# Patient Record
Sex: Male | Born: 1995 | Hispanic: Yes | Marital: Single | Smoking: Light tobacco smoker
Health system: Southern US, Community
[De-identification: ages and names within clinical notes are randomized; demographics above are authoritative.]

## PROBLEM LIST (undated history)

## (undated) DIAGNOSIS — F909 Attention-deficit hyperactivity disorder, unspecified type: Secondary | ICD-10-CM

## (undated) DIAGNOSIS — F319 Bipolar disorder, unspecified: Secondary | ICD-10-CM

## (undated) HISTORY — PX: TESTICLE SURGERY: SHX794

## (undated) HISTORY — PX: TONSILLECTOMY: SUR1361

## (undated) HISTORY — PX: URETHRA SURGERY: SHX824

---

## 2014-03-29 ENCOUNTER — Emergency Department (HOSPITAL_BASED_OUTPATIENT_CLINIC_OR_DEPARTMENT_OTHER)
Admission: EM | Admit: 2014-03-29 | Discharge: 2014-03-29 | Disposition: A | Discharge status: Home / Self Care | Payer: Self-pay | Attending: Emergency Medicine | Admitting: Emergency Medicine

## 2014-03-29 ENCOUNTER — Encounter (HOSPITAL_BASED_OUTPATIENT_CLINIC_OR_DEPARTMENT_OTHER): Payer: Self-pay | Admitting: Emergency Medicine

## 2014-03-29 DIAGNOSIS — Z043 Encounter for examination and observation following other accident: Secondary | ICD-10-CM | POA: Insufficient documentation

## 2014-03-29 DIAGNOSIS — Y9389 Activity, other specified: Secondary | ICD-10-CM | POA: Insufficient documentation

## 2014-03-29 DIAGNOSIS — Z8659 Personal history of other mental and behavioral disorders: Secondary | ICD-10-CM | POA: Insufficient documentation

## 2014-03-29 DIAGNOSIS — Y92410 Unspecified street and highway as the place of occurrence of the external cause: Secondary | ICD-10-CM | POA: Insufficient documentation

## 2014-03-29 HISTORY — DX: Attention-deficit hyperactivity disorder, unspecified type: F90.9

## 2014-03-29 MED ORDER — ACETAMINOPHEN 325 MG PO TABS
650.0000 mg | ORAL_TABLET | Freq: Once | ORAL | Status: AC
  Administered 2014-03-29: 650 mg via ORAL
  Filled 2014-03-29: qty 2
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
## 2014-03-29 NOTE — Discharge Instructions (Signed)
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
## 2015-03-01 ENCOUNTER — Emergency Department (HOSPITAL_COMMUNITY): Payer: 59

## 2015-03-01 ENCOUNTER — Emergency Department (HOSPITAL_COMMUNITY)
Admission: EM | Admit: 2015-03-01 | Discharge: 2015-03-01 | Disposition: A | Discharge status: Home / Self Care | Payer: 59 | Attending: Emergency Medicine | Admitting: Emergency Medicine

## 2015-03-01 ENCOUNTER — Encounter (HOSPITAL_COMMUNITY): Payer: Self-pay

## 2015-03-01 DIAGNOSIS — Y92410 Unspecified street and highway as the place of occurrence of the external cause: Secondary | ICD-10-CM | POA: Diagnosis not present

## 2015-03-01 DIAGNOSIS — S52602A Unspecified fracture of lower end of left ulna, initial encounter for closed fracture: Secondary | ICD-10-CM

## 2015-03-01 DIAGNOSIS — S40812A Abrasion of left upper arm, initial encounter: Secondary | ICD-10-CM

## 2015-03-01 DIAGNOSIS — S52615A Nondisplaced fracture of left ulna styloid process, initial encounter for closed fracture: Secondary | ICD-10-CM | POA: Insufficient documentation

## 2015-03-01 DIAGNOSIS — Z720 Tobacco use: Secondary | ICD-10-CM | POA: Diagnosis not present

## 2015-03-01 DIAGNOSIS — S59912A Unspecified injury of left forearm, initial encounter: Secondary | ICD-10-CM | POA: Diagnosis present

## 2015-03-01 DIAGNOSIS — Y998 Other external cause status: Secondary | ICD-10-CM | POA: Insufficient documentation

## 2015-03-01 DIAGNOSIS — Z8659 Personal history of other mental and behavioral disorders: Secondary | ICD-10-CM | POA: Insufficient documentation

## 2015-03-01 DIAGNOSIS — S52515A Nondisplaced fracture of left radial styloid process, initial encounter for closed fracture: Secondary | ICD-10-CM | POA: Insufficient documentation

## 2015-03-01 DIAGNOSIS — S50312A Abrasion of left elbow, initial encounter: Secondary | ICD-10-CM | POA: Diagnosis not present

## 2015-03-01 DIAGNOSIS — S52502A Unspecified fracture of the lower end of left radius, initial encounter for closed fracture: Secondary | ICD-10-CM

## 2015-03-01 DIAGNOSIS — Y9389 Activity, other specified: Secondary | ICD-10-CM | POA: Diagnosis not present

## 2015-03-01 HISTORY — DX: Bipolar disorder, unspecified: F31.9

## 2015-03-01 MED ORDER — HYDROMORPHONE HCL 1 MG/ML IJ SOLN
1.0000 mg | Freq: Once | INTRAMUSCULAR | Status: AC
  Administered 2015-03-01: 1 mg via INTRAMUSCULAR
  Filled 2015-03-01: qty 1

## 2015-03-01 MED ORDER — OXYCODONE-ACETAMINOPHEN 5-325 MG PO TABS: 1.0000 | ORAL_TABLET | Freq: Four times a day (QID) | ORAL | Status: DC | PRN

## 2015-03-01 MED ORDER — TETANUS-DIPHTH-ACELL PERTUSSIS 5-2.5-18.5 LF-MCG/0.5 IM SUSP
0.5000 mL | Freq: Once | INTRAMUSCULAR | Status: AC
Start: 2015-03-01 — End: 2015-03-01
  Administered 2015-03-01: 0.5 mL via INTRAMUSCULAR
  Filled 2015-03-01: qty 0.5

## 2015-03-01 MED ORDER — ACETAMINOPHEN 325 MG PO TABS
650.0000 mg | ORAL_TABLET | Freq: Once | ORAL | Status: AC
  Administered 2015-03-01: 650 mg via ORAL
  Filled 2015-03-01: qty 2

## 2015-03-01 NOTE — ED Notes (Signed)
Ortho tech paged to apply splint.

## 2015-03-01 NOTE — Discharge Instructions (Signed)
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
## 2015-03-01 NOTE — Progress Notes (Signed)
Orthopedic Tech Progress Note Patient Details:  Craig MannanJosiah Sullivan 08/10/1996 [id number]  Ortho Devices Type of Ortho Device: Ace wrap, Arm sling, Sugartong splint Ortho Device/Splint Location: lue Ortho Device/Splint Interventions: Application   Ryann Pauli 03/01/2015, 8:57 AM
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
## 2015-10-21 ENCOUNTER — Emergency Department (HOSPITAL_BASED_OUTPATIENT_CLINIC_OR_DEPARTMENT_OTHER)
Admission: EM | Admit: 2015-10-21 | Discharge: 2015-10-21 | Disposition: A | Discharge status: Home / Self Care | Payer: 59 | Attending: Emergency Medicine | Admitting: Emergency Medicine

## 2015-10-21 ENCOUNTER — Encounter (HOSPITAL_BASED_OUTPATIENT_CLINIC_OR_DEPARTMENT_OTHER): Payer: Self-pay | Admitting: *Deleted

## 2015-10-21 DIAGNOSIS — R300 Dysuria: Secondary | ICD-10-CM | POA: Diagnosis not present

## 2015-10-21 DIAGNOSIS — Z8659 Personal history of other mental and behavioral disorders: Secondary | ICD-10-CM | POA: Insufficient documentation

## 2015-10-21 DIAGNOSIS — R05 Cough: Secondary | ICD-10-CM | POA: Diagnosis present

## 2015-10-21 DIAGNOSIS — M545 Low back pain: Secondary | ICD-10-CM | POA: Diagnosis not present

## 2015-10-21 DIAGNOSIS — R69 Illness, unspecified: Secondary | ICD-10-CM

## 2015-10-21 DIAGNOSIS — F17210 Nicotine dependence, cigarettes, uncomplicated: Secondary | ICD-10-CM | POA: Insufficient documentation

## 2015-10-21 DIAGNOSIS — J111 Influenza due to unidentified influenza virus with other respiratory manifestations: Secondary | ICD-10-CM | POA: Insufficient documentation

## 2015-10-21 LAB — URINALYSIS, ROUTINE W REFLEX MICROSCOPIC
Bilirubin Urine: NEGATIVE
Glucose, UA: NEGATIVE mg/dL
Hgb urine dipstick: NEGATIVE
KETONES UR: NEGATIVE mg/dL
NITRITE: NEGATIVE
PH: 6.5 (ref 5.0–8.0)
PROTEIN: NEGATIVE mg/dL
Specific Gravity, Urine: 1.006 (ref 1.005–1.030)

## 2015-10-21 LAB — URINE MICROSCOPIC-ADD ON: RBC / HPF: NONE SEEN RBC/hpf (ref 0–5)

## 2015-10-21 NOTE — Discharge Instructions (Signed)
Please read and follow all provided instructions.  Your diagnoses today include:  1. Influenza-like illness    Tests performed today include:  Vital signs. See below for your results today.   Medications prescribed:   None  Take any prescribed medications only as directed.  Home care instructions:  Follow any educational materials contained in this packet. Please continue drinking plenty of fluids. Use over-the-counter cold and flu medications as needed as directed on packaging for symptom relief. You may also use ibuprofen or tylenol as directed on packaging for pain or fever.   BE VERY CAREFUL not to take multiple medicines containing Tylenol (also called acetaminophen). Doing so can lead to an overdose which can damage your liver and cause liver failure and possibly death.   Follow-up instructions: Please follow-up with your primary care provider in the next 3 days for further evaluation of your symptoms if not improved.   Return instructions:   Please return to the Emergency Department if you experience worsening symptoms.  Please return if you have a high fever greater than 101 degrees not controlled with over-the-counter medications, persistent vomiting and cannot keep down fluids, or worsening trouble breathing.  Please return if you have any other emergent concerns.  Additional Information:  Your vital signs today were: BP 156/82 mmHg   Pulse 99   Temp(Src) 100.3 F (37.9 C) (Oral)   Resp 20   Wt 65.772 kg   SpO2 98% If your blood pressure (BP) was elevated above 135/85 this visit, please have this repeated by your doctor within one month.

## 2015-10-21 NOTE — ED Provider Notes (Signed)
CSN: [id number]     Arrival date & time 10/21/15  2003 History   First MD Initiated Contact with Patient 10/21/15 2256     Chief Complaint  Patient presents with  . Illness     (Consider location/radiation/quality/duration/timing/severity/associated sxs/prior Treatment) HPI Comments: Patient presents with complaint of fever, chills, body aches, occasional cough, nasal congestion, lower back cramps, and dysuria at end of urine stream starting yesterday after taking a nap. Patient states symptoms started suddenly. Possible sick contacts. No nausea, vomiting, or diarrhea. No hematuria or increased frequency or urgency. Onset of symptoms acute. Course is constant. Nothing makes symptoms better or worse. Patient has been taking DayQuil which has helped.  Patient is a 20 y.o. male presenting with general illness. The history is provided by the patient.  Illness Associated symptoms: congestion, cough, fatigue, fever, headaches, myalgias and rhinorrhea   Associated symptoms: no abdominal pain, no diarrhea, no ear pain, no nausea, no rash, no sore throat, no vomiting and no wheezing     Past Medical History  Diagnosis Date  . ADHD (attention deficit hyperactivity disorder)   . Bipolar 1 disorder [facility]    Past Surgical History  Procedure Laterality Date  . Urethra surgery    . Testicle surgery     No family history on file. Social History  Substance Use Topics  . Smoking status: Light Tobacco Smoker    Types: Cigars  . Smokeless tobacco: None  . Alcohol Use: No    Review of Systems  Constitutional: Positive for fever, chills and fatigue.  HENT: Positive for congestion and rhinorrhea. Negative for ear pain, sinus pressure and sore throat.   Eyes: Negative for redness.  Respiratory: Positive for cough. Negative for wheezing.   Gastrointestinal: Negative for nausea, vomiting, abdominal pain and diarrhea.  Genitourinary: Positive for dysuria. Negative for frequency and hematuria.   Musculoskeletal: Positive for myalgias. Negative for neck stiffness.  Skin: Negative for rash.  Neurological: Positive for headaches.  Hematological: Negative for adenopathy.    Allergies  Review of patient's allergies indicates no known allergies.  Home Medications   Prior to Admission medications   Medication Sig Start Date End Date Taking? Authorizing Provider  oxyCODONE-acetaminophen (PERCOCET) 5-325 MG per tablet Take 1 tablet by mouth every 6 (six) hours as needed for moderate pain. 03/01/15   Purvis Sheffield, MD   BP 156/82 mmHg  Pulse 99  Temp(Src) 100.3 F (37.9 C) (Oral)  Resp 20  Wt 65.772 kg  SpO2 98%   Physical Exam  Constitutional: He appears well-developed and well-nourished.  HENT:  Head: Normocephalic and atraumatic.  Right Ear: Tympanic membrane, external ear and ear canal normal.  Left Ear: Tympanic membrane, external ear and ear canal normal.  Nose: Nose normal. No mucosal edema or rhinorrhea.  Mouth/Throat: Uvula is midline, oropharynx is clear and moist and mucous membranes are normal. Mucous membranes are not dry. No trismus in the jaw. No uvula swelling. No oropharyngeal exudate, posterior oropharyngeal edema, posterior oropharyngeal erythema or tonsillar abscesses.  Eyes: Conjunctivae are normal. Right eye exhibits no discharge. Left eye exhibits no discharge.  Neck: Normal range of motion. Neck supple.  Cardiovascular: Normal rate, regular rhythm and normal heart sounds.   Pulmonary/Chest: Effort normal and breath sounds normal. No respiratory distress. He has no wheezes. He has no rales.  Abdominal: Soft. There is no tenderness.  Neurological: He is alert.  Skin: Skin is warm and dry.  Psychiatric: He has a normal mood and affect.  Nursing note  and vitals reviewed.   ED Course  Procedures (including critical care time) Labs Review Labs Reviewed  URINALYSIS, ROUTINE W REFLEX MICROSCOPIC (NOT AT [facility]) - Abnormal; Notable for the following:     Leukocytes, UA SMALL (*)    All other components within normal limits  URINE MICROSCOPIC-ADD ON - Abnormal; Notable for the following:    Squamous Epithelial / LPF 0-5 (*)    Bacteria, UA RARE (*)    All other components within normal limits    Imaging Review No results found. I have personally reviewed and evaluated these images and lab results as part of my medical decision-making.   EKG Interpretation None      11:34 PM Patient seen and examined.    Vital signs reviewed and are as follows: BP 156/82 mmHg  Pulse 99  Temp(Src) 100.3 F (37.9 C) (Oral)  Resp 20  Wt 65.772 kg  SpO2 98%  Patient discharged to home. Encouraged to rest and drink plenty of fluids.  Patient told to return to ED or see their primary doctor if their symptoms worsen, high fever not controlled with tylenol, persistent vomiting, they feel they are dehydrated, or if they have any other concerns.  Patient verbalized understanding and agreed with plan.     MDM   Final diagnoses:  Influenza-like illness   Patient with symptoms consistent with influenza. Vitals are stable, low-grade fever. No signs of dehydration, tolerating PO's. Lungs are clear. Supportive therapy indicated with return if symptoms worsen. Patient counseled.    Renne Crigler, PA-C 10/21/15 2334  Cy Blamer, MD 10/21/15 [phone]

## 2015-10-21 NOTE — ED Notes (Addendum)
Pt has been sick since yesterday with body aches, soreness in kidneys. URI and feeling generally unwell.  Pt also reports discomfort with urination.  Pt was recently exposed to someone that had flu like symptoms

## 2018-06-12 ENCOUNTER — Encounter (HOSPITAL_BASED_OUTPATIENT_CLINIC_OR_DEPARTMENT_OTHER): Payer: Self-pay

## 2018-06-12 ENCOUNTER — Emergency Department (HOSPITAL_BASED_OUTPATIENT_CLINIC_OR_DEPARTMENT_OTHER)
Admission: EM | Admit: 2018-06-12 | Discharge: 2018-06-12 | Disposition: A | Discharge status: Home / Self Care | Payer: 59 | Attending: Emergency Medicine | Admitting: Emergency Medicine

## 2018-06-12 ENCOUNTER — Other Ambulatory Visit: Payer: Self-pay

## 2018-06-12 DIAGNOSIS — F909 Attention-deficit hyperactivity disorder, unspecified type: Secondary | ICD-10-CM | POA: Insufficient documentation

## 2018-06-12 DIAGNOSIS — F17290 Nicotine dependence, other tobacco product, uncomplicated: Secondary | ICD-10-CM | POA: Insufficient documentation

## 2018-06-12 DIAGNOSIS — L0291 Cutaneous abscess, unspecified: Secondary | ICD-10-CM

## 2018-06-12 DIAGNOSIS — L0231 Cutaneous abscess of buttock: Secondary | ICD-10-CM | POA: Insufficient documentation

## 2018-06-12 MED ORDER — DOXYCYCLINE HYCLATE 100 MG PO CAPS: 100.0000 mg | ORAL_CAPSULE | Freq: Two times a day (BID) | ORAL | 0 refills | Status: DC

## 2018-06-12 MED ORDER — LIDOCAINE-EPINEPHRINE (PF) 2 %-1:200000 IJ SOLN
20.0000 mL | Freq: Once | INTRAMUSCULAR | Status: AC
  Administered 2018-06-12: 10 mL
  Filled 2018-06-12: qty 20

## 2018-06-12 MED ORDER — HYDROCODONE-ACETAMINOPHEN 5-325 MG PO TABS: 1.0000 | ORAL_TABLET | Freq: Four times a day (QID) | ORAL | 0 refills | Status: DC | PRN

## 2018-06-12 MED ORDER — LIDOCAINE-EPINEPHRINE (PF) 2 %-1:200000 IJ SOLN
INTRAMUSCULAR | Status: AC
  Administered 2018-06-12: 10 mL
  Filled 2018-06-12: qty 10

## 2018-06-12 NOTE — ED Notes (Signed)
Abscess to left hip x 3 days  Getting and increased pain
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
## 2018-06-12 NOTE — ED Triage Notes (Signed)
Pt c/o abscess to gluteal cleft.

## 2018-06-12 NOTE — Discharge Instructions (Addendum)
I have given you a prescription today for antibiotics.  Most likely this will start to get better in the next 2 to 3 days on its own without antibiotic treatment.  We will still most likely remain red and swollen, and painful however should start gradually getting better.  If it does not start getting better in the next 2 to 3 days, or worsens then please fill and start taking the antibiotic.  If you start getting fevers, chills, nausea vomiting diarrhea, worsening symptoms or have additional concerns then you need to return to the emergency room rather than starting the antibiotic.  Please take Ibuprofen (Advil, motrin) and Tylenol (acetaminophen) to relieve your pain.  You may take up to 600 MG (3 pills) of normal strength ibuprofen every 8 hours as needed.  In between doses of ibuprofen you make take tylenol, up to 1,000 mg (two extra strength pills).  Do not take more than 3,000 mg tylenol in a 24 hour period.  Please check all medication labels as many medications such as pain and cold medications may contain tylenol.  Do not drink alcohol while taking these medications.  Do not take other NSAID'S while taking ibuprofen (such as aleve or naproxen).  Please take ibuprofen with food to decrease stomach upset.  You are being prescribed a medication which may make you sleepy. For 24 hours after one dose please do not drive, operate heavy machinery, care for a small child with out another adult present, or perform any activities that may cause harm to you or someone else if you were to fall asleep or be impaired.   You may have diarrhea from the antibiotics.  It is very important that you continue to take the antibiotics even if you get diarrhea unless a medical professional tells you that you may stop taking them.  If you stop too early the bacteria you are being treated for will become stronger and you may need different, more powerful antibiotics that have more side effects and worsening diarrhea.  Please  stay well hydrated and consider probiotics as they may decrease the severity of your diarrhea.

## 2019-05-29 ENCOUNTER — Emergency Department (HOSPITAL_COMMUNITY): Payer: Self-pay

## 2019-05-29 ENCOUNTER — Emergency Department (HOSPITAL_COMMUNITY)
Admission: EM | Admit: 2019-05-29 | Discharge: 2019-05-29 | Disposition: A | Discharge status: Home / Self Care | Payer: Self-pay | Attending: Emergency Medicine | Admitting: Emergency Medicine

## 2019-05-29 ENCOUNTER — Other Ambulatory Visit: Payer: Self-pay

## 2019-05-29 ENCOUNTER — Encounter (HOSPITAL_COMMUNITY): Payer: Self-pay | Admitting: Emergency Medicine

## 2019-05-29 DIAGNOSIS — R079 Chest pain, unspecified: Secondary | ICD-10-CM

## 2019-05-29 DIAGNOSIS — F17290 Nicotine dependence, other tobacco product, uncomplicated: Secondary | ICD-10-CM | POA: Insufficient documentation

## 2019-05-29 DIAGNOSIS — F419 Anxiety disorder, unspecified: Secondary | ICD-10-CM | POA: Insufficient documentation

## 2019-05-29 DIAGNOSIS — R0789 Other chest pain: Secondary | ICD-10-CM | POA: Insufficient documentation

## 2019-05-29 DIAGNOSIS — R202 Paresthesia of skin: Secondary | ICD-10-CM | POA: Insufficient documentation

## 2019-05-29 DIAGNOSIS — Z7151 Drug abuse counseling and surveillance of drug abuser: Secondary | ICD-10-CM | POA: Insufficient documentation

## 2019-05-29 DIAGNOSIS — Z79899 Other long term (current) drug therapy: Secondary | ICD-10-CM | POA: Insufficient documentation

## 2019-05-29 LAB — CBC
HCT: 44.7 % (ref 39.0–52.0)
Hemoglobin: 14.7 g/dL (ref 13.0–17.0)
MCH: 26.9 pg (ref 26.0–34.0)
MCHC: 32.9 g/dL (ref 30.0–36.0)
MCV: 81.9 fL (ref 80.0–100.0)
Platelets: 215 10*3/uL (ref 150–400)
RBC: 5.46 MIL/uL (ref 4.22–5.81)
RDW: 13 % (ref 11.5–15.5)
WBC: 4.8 10*3/uL (ref 4.0–10.5)
nRBC: 0 % (ref 0.0–0.2)

## 2019-05-29 LAB — BASIC METABOLIC PANEL
Anion gap: 11 (ref 5–15)
BUN: 9 mg/dL (ref 6–20)
CO2: 25 mmol/L (ref 22–32)
Calcium: 9.8 mg/dL (ref 8.9–10.3)
Chloride: 102 mmol/L (ref 98–111)
Creatinine, Ser: 0.98 mg/dL (ref 0.61–1.24)
GFR calc Af Amer: >60 mL/min (ref >60)
GFR calc non Af Amer: >60 mL/min (ref >60)
Glucose, Bld: 107 mg/dL — ABNORMAL HIGH (ref 70–99)
Potassium: 3.9 mmol/L (ref 3.5–5.1)
Sodium: 138 mmol/L (ref 135–145)

## 2019-05-29 LAB — TROPONIN I (HIGH SENSITIVITY): Troponin I (High Sensitivity): <2 ng/L (ref <18)

## 2019-05-29 MED ORDER — SODIUM CHLORIDE 0.9% FLUSH: 3.0000 mL | Freq: Once | INTRAVENOUS | Status: DC

## 2019-05-29 MED ORDER — PANTOPRAZOLE SODIUM 20 MG PO TBEC: 20.0000 mg | DELAYED_RELEASE_TABLET | Freq: Every day | ORAL | 0 refills | Status: DC

## 2019-05-29 NOTE — Discharge Instructions (Signed)
Your tests are totally normal, there is no signs of heart attack, no signs of pneumonia, your x-ray and blood work were normal as was your EKG.  Please start taking the medication called Protonix once a day.  Avoid medication such as aspirin or ibuprofen or Aleve as they can cause worsening symptoms if this is related to acid reflux.  I have given you the phone number for a family doctor, please call to make the next available appointment.  Return to the emergency department for severe or worsening symptoms.  Please stop using marijuana as this can lead to further lung disease down the road in your future.
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
## 2019-05-29 NOTE — ED Triage Notes (Signed)
Pt here with c/o left sided chest pain and left arm tingling , pt has been under a lot of stress and just wants to get checked out

## 2019-05-29 NOTE — ED Notes (Signed)
Pt dc'd home w/all belongings, a/o x4, ambulatory on dc, drove himself home, no narcotics given in ED

## 2021-01-23 IMAGING — DX DG CHEST 2V
2 series · 2 of 2 positions shown · non-contrast
Comparison: None.

CLINICAL DATA: Chest pain

EXAM:
CHEST - 2 VIEW

[chest pa]
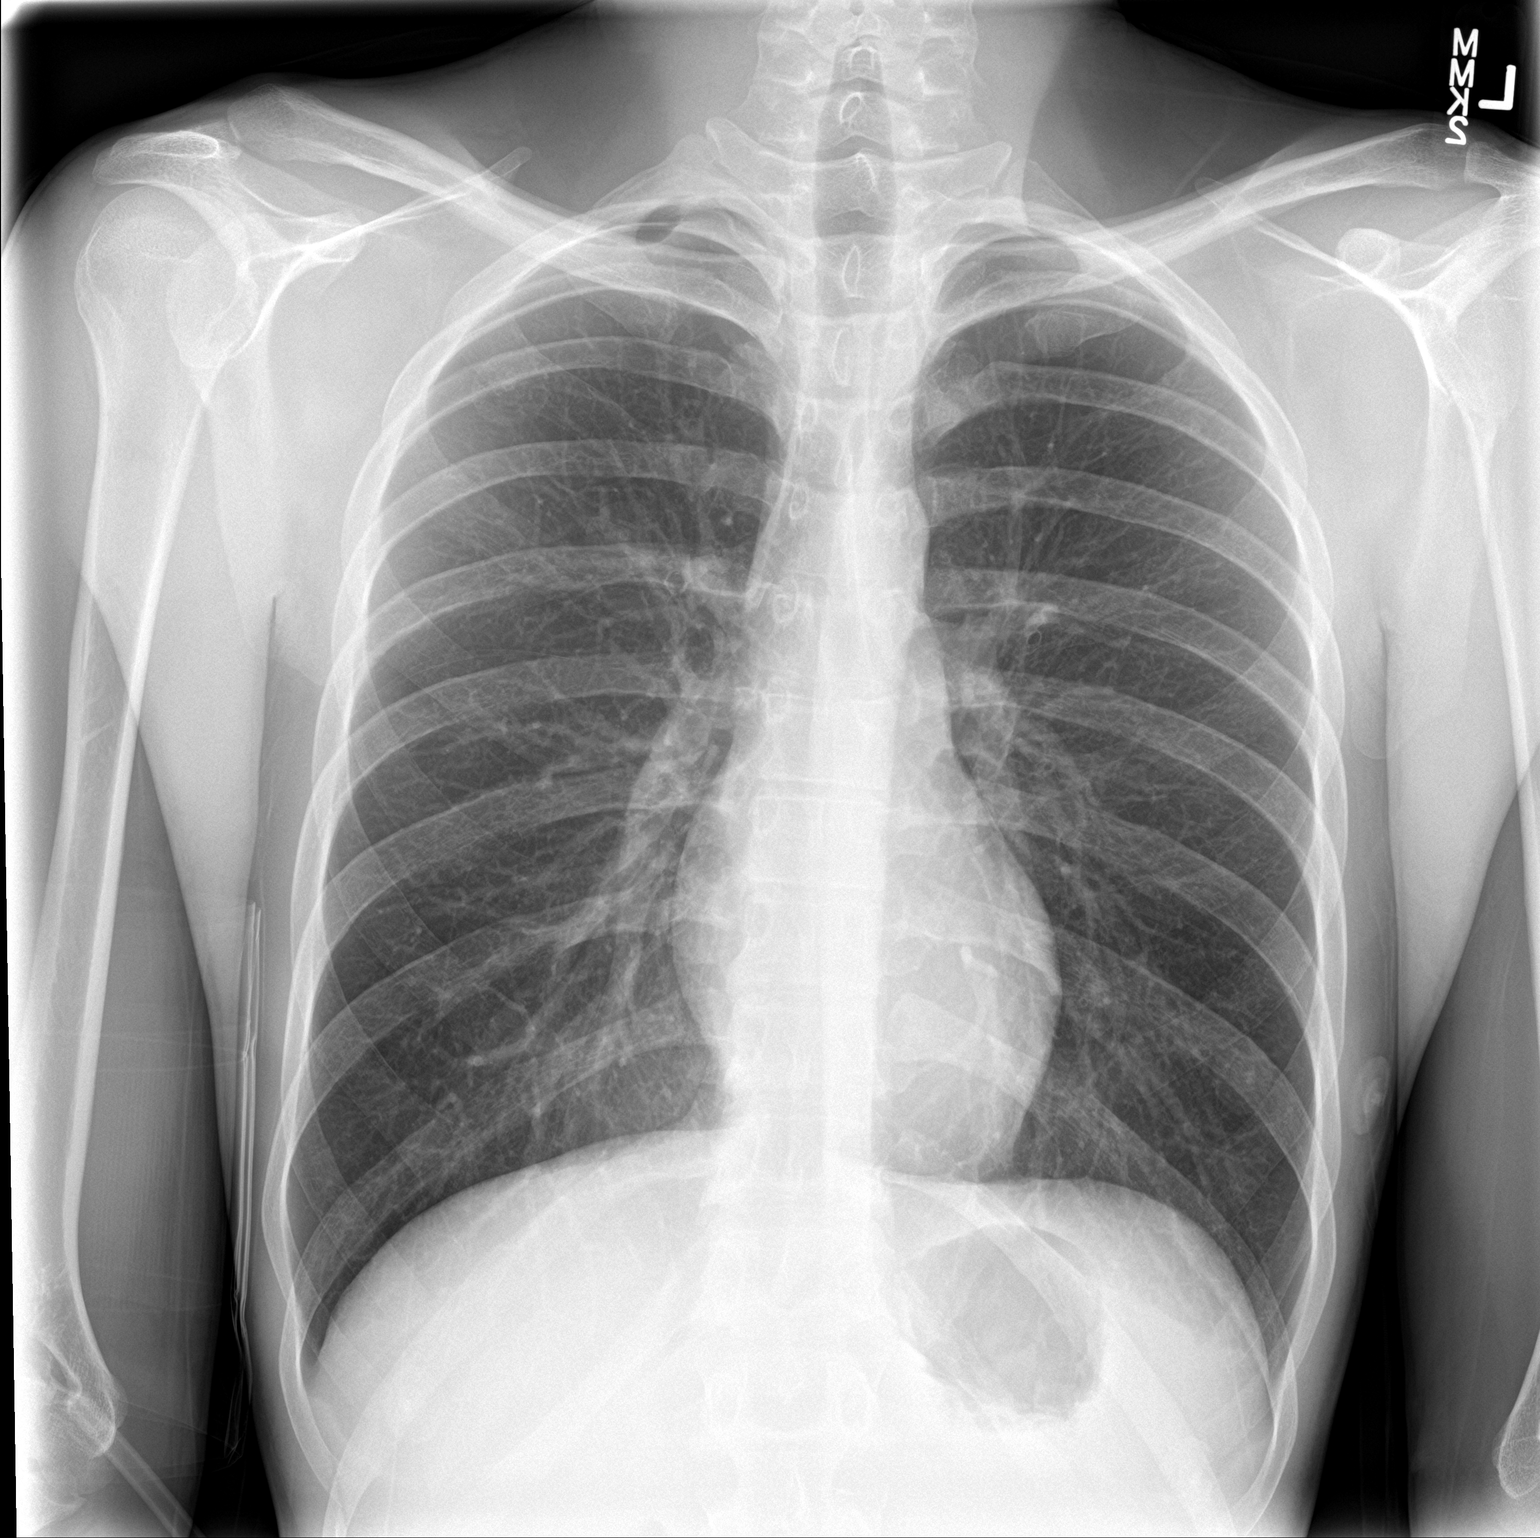

[chest lat]
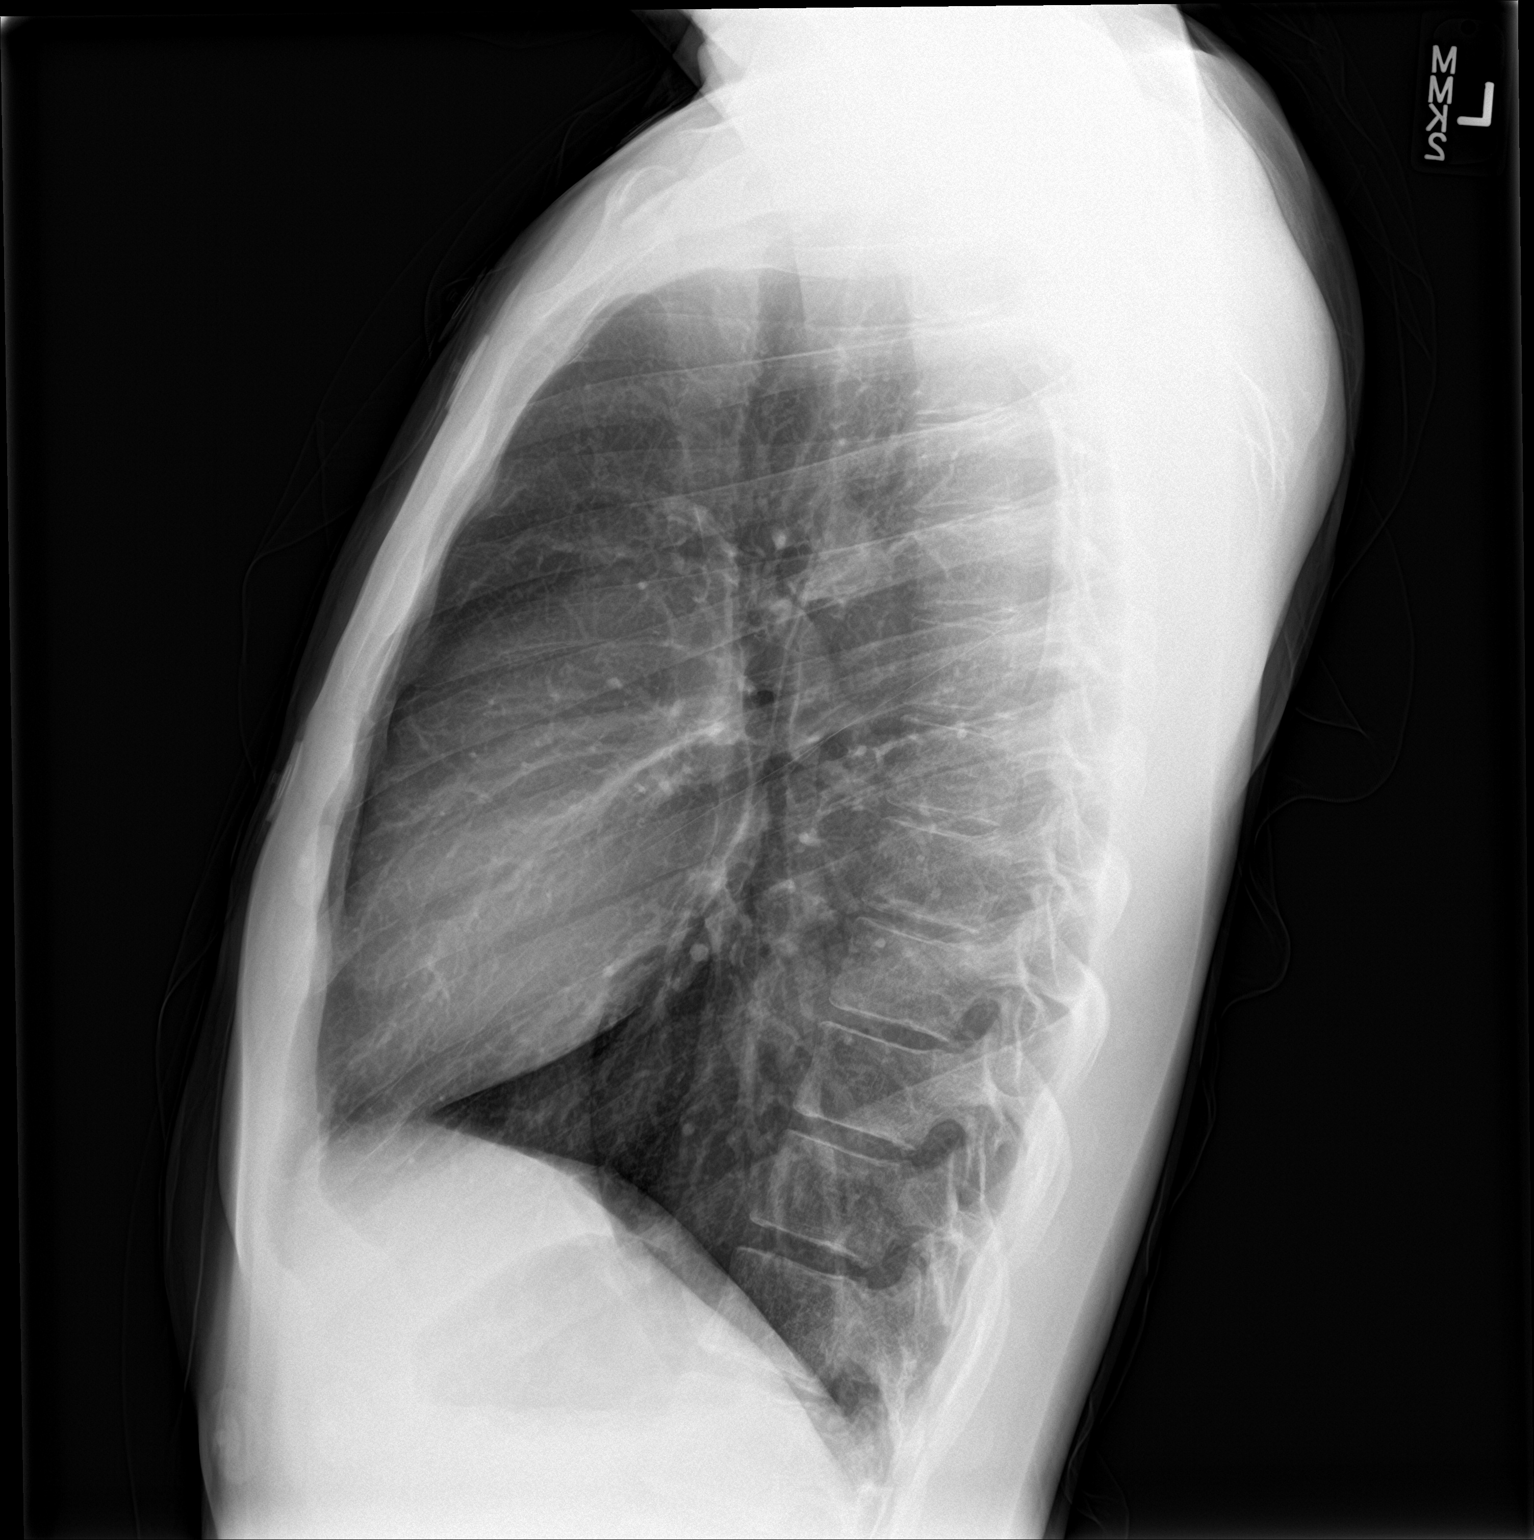

[2 of 2 positions shown; findings below may reference images not displayed]

FINDINGS: The heart size and mediastinal contours are within normal limits.
Both lungs are clear. The visualized skeletal structures are
unremarkable.
IMPRESSION: No active cardiopulmonary disease.

## 2023-05-13 ENCOUNTER — Other Ambulatory Visit: Payer: Self-pay

## 2023-05-13 ENCOUNTER — Emergency Department (HOSPITAL_BASED_OUTPATIENT_CLINIC_OR_DEPARTMENT_OTHER)
Admission: EM | Admit: 2023-05-13 | Discharge: 2023-05-13 | Disposition: A | Discharge status: Home / Self Care | Payer: Self-pay | Attending: Emergency Medicine | Admitting: Emergency Medicine

## 2023-05-13 ENCOUNTER — Encounter (HOSPITAL_BASED_OUTPATIENT_CLINIC_OR_DEPARTMENT_OTHER): Payer: Self-pay

## 2023-05-13 DIAGNOSIS — R300 Dysuria: Secondary | ICD-10-CM | POA: Insufficient documentation

## 2023-05-13 LAB — URINALYSIS, ROUTINE W REFLEX MICROSCOPIC
Bilirubin Urine: NEGATIVE
Glucose, UA: NEGATIVE mg/dL
Hgb urine dipstick: NEGATIVE
Ketones, ur: NEGATIVE mg/dL
Leukocytes,Ua: NEGATIVE
Nitrite: NEGATIVE
Protein, ur: NEGATIVE mg/dL
Specific Gravity, Urine: >=1.03 (ref 1.005–1.030)
pH: 6 (ref 5.0–8.0)

## 2023-05-13 NOTE — ED Provider Notes (Signed)
[place] EMERGENCY DEPARTMENT AT [facility] Provider Note   CSN: [id number] Arrival date & time: 05/13/23  1608     History  Chief Complaint  Patient presents with   Dysuria    Craig Sullivan is a 27 y.o. male.  Patient presents the emergency department for evaluation of intermittent dysuria with increased cloudiness and odor to the urine.  He feels at times his urine stream is difficult to get started as well.  And ongoing over the past 3 years.  He has had UTI in the past and was treated earlier in the year.  He is sexually active with 1 partner.  He is expecting a daughter soon.  He denies fevers, abdominal pain.  No sores or lesions to the external genitalia.  No testicle tenderness or swelling.  No treatments prior to arrival.  Does state that he has been trying to increase the protein in his diet.  He has taken B vitamins.      Home Medications Prior to Admission medications   Not on File      Allergies    Patient has no known allergies.    Review of Systems   Review of Systems  Physical Exam Updated Vital Signs BP (!) 135/92 (BP Location: Right Arm)   Pulse 97   Temp 98.5 F (36.9 C) (Oral)   Resp 16   Ht 5\' 10"  (1.778 m)   Wt 63.5 kg   SpO2 98%   BMI 20.09 kg/m   Physical Exam Vitals and nursing note reviewed.  Constitutional:      Appearance: He is well-developed.  HENT:     Head: Normocephalic and atraumatic.  Eyes:     Conjunctiva/sclera: Conjunctivae normal.  Pulmonary:     Effort: No respiratory distress.  Genitourinary:    Penis: Circumcised.      Testes: Normal.        Right: Swelling not present.        Left: Swelling not present.     Comments: GU exam without abnormalities.  Musculoskeletal:     Cervical back: Normal range of motion and neck supple.  Skin:    General: Skin is warm and dry.  Neurological:     Mental Status: He is alert.     ED Results / Procedures / Treatments   Labs (all labs ordered are listed,  but only abnormal results are displayed) Labs Reviewed  URINALYSIS, ROUTINE W REFLEX MICROSCOPIC  GC/CHLAMYDIA PROBE AMP ([place]) NOT AT [facility]    EKG None  Radiology No results found.  Procedures Procedures    Medications Ordered in ED Medications - No data to display  ED Course/ Medical Decision Making/ A&P    Patient seen and examined. History obtained directly from patient. Work-up including labs, imaging, EKG ordered in triage, if performed, were reviewed.    Labs/EKG: Independently reviewed and interpreted.  This included: UA, clear without signs of infection.  No protein.  No hemoglobin or leukocyte esterase.  Added on testing for gonorrhea and chlamydia.  Imaging: None ordered  Medications/Fluids: None ordered  Most recent vital signs reviewed and are as follows: BP (!) 135/92 (BP Location: Right Arm)   Pulse 97   Temp 98.5 F (36.9 C) (Oral)   Resp 16   Ht 5\' 10"  (1.778 m)   Wt 63.5 kg   SpO2 98%   BMI 20.09 kg/m   Initial impression: Dysuria, chronic, intermittent  Home treatment plan: Maintain good hydration  Return instructions  discussed with patient: New symptoms or other concerns  Follow-up instructions discussed with patient: PCP or urology follow-up as needed.  Will give referral.                                Medical Decision Making Amount and/or Complexity of Data Reviewed Labs: ordered.   Patient with several symptoms including dysuria, strong odor to the urine, cloudiness.  UA is negative today.  Patient likely low risk for STI, however testing for gonorrhea and chlamydia sent to the past history.  Overall this has been a recurrent standing problem for the patient.  No emergent condition suspected.  Normal external exam.  He would benefit from PCP care for urology consultation if not improving in the future.  No indication for admission today.  The patient's vital signs, pertinent lab work and imaging were reviewed and interpreted as  discussed in the ED course. Hospitalization was considered for further testing, treatments, or serial exams/observation. However as patient is well-appearing, has a stable exam, and reassuring studies today, I do not feel that they warrant admission at this time. This plan was discussed with the patient who verbalizes agreement and comfort with this plan and seems reliable and able to return to the Emergency Department with worsening or changing symptoms.          Final Clinical Impression(s) / ED Diagnoses Final diagnoses:  Dysuria    Rx / DC Orders ED Discharge Orders     None         Renne Crigler, Cordelia Poche 05/13/23 Hiram Gash, MD 05/13/23 1844

## 2023-05-13 NOTE — Discharge Instructions (Signed)
Your urine testing today did not show signs of a simple bladder infection.  Please follow-up with primary care or urology when able for further evaluation of your symptoms if they continue.

## 2023-05-13 NOTE — ED Notes (Signed)
Lab will add GC and CHL to pts urine

## 2023-05-13 NOTE — ED Triage Notes (Signed)
Pt presents to ED from home C/O cloudy urine, odor to urine, intermittent burning with urination, and bladder fullness after urination X 2 years.

## 2023-05-14 LAB — GC/CHLAMYDIA PROBE AMP (~~LOC~~) NOT AT ARMC
Chlamydia: NEGATIVE
Comment: NEGATIVE
Comment: NORMAL
Neisseria Gonorrhea: NEGATIVE
